# Patient Record
Sex: Male | Born: 2005 | Race: Black or African American | Hispanic: No | Marital: Single
Health system: Southern US, Community
[De-identification: ages and names within clinical notes are randomized; demographics above are authoritative.]

## PROBLEM LIST (undated history)

## (undated) DIAGNOSIS — H6690 Otitis media, unspecified, unspecified ear: Secondary | ICD-10-CM

---
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
## 2024-04-20 ENCOUNTER — Ambulatory Visit (HOSPITAL_COMMUNITY): Admission: EM | Admit: 2024-04-20 | Discharge: 2024-04-20 | Disposition: A | Discharge status: Home / Self Care

## 2024-04-20 ENCOUNTER — Encounter (HOSPITAL_COMMUNITY): Payer: Self-pay | Admitting: Emergency Medicine

## 2024-04-20 DIAGNOSIS — S61411A Laceration without foreign body of right hand, initial encounter: Secondary | ICD-10-CM | POA: Diagnosis not present

## 2024-04-20 DIAGNOSIS — Z23 Encounter for immunization: Secondary | ICD-10-CM | POA: Diagnosis not present

## 2024-04-20 MED ORDER — DOXYCYCLINE HYCLATE 100 MG PO CAPS: 100.0000 mg | ORAL_CAPSULE | Freq: Two times a day (BID) | ORAL | 0 refills | Status: AC

## 2024-04-20 MED ORDER — TETANUS-DIPHTH-ACELL PERTUSSIS 5-2-15.5 LF-MCG/0.5 IM SUSP
INTRAMUSCULAR | Status: AC
  Filled 2024-04-20: qty 0.5

## 2024-04-20 MED ORDER — DICLOFENAC SODIUM 50 MG PO TBEC: 50.0000 mg | DELAYED_RELEASE_TABLET | Freq: Two times a day (BID) | ORAL | 1 refills | Status: AC

## 2024-04-20 MED ORDER — TETANUS-DIPHTH-ACELL PERTUSSIS 5-2-15.5 LF-MCG/0.5 IM SUSP
0.5000 mL | Freq: Once | INTRAMUSCULAR | Status: AC
  Administered 2024-04-20: 0.5 mL via INTRAMUSCULAR

## 2024-04-20 NOTE — ED Triage Notes (Signed)
 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 Pt reports pain with certain movements of hand.

## 2024-04-20 NOTE — ED Provider Notes (Signed)
 " [facility] [place]  Note:  This document was prepared using Dragon voice recognition software and may include unintentional dictation errors.  MRN: [id number] DOB: [birth year]  Subjective:   Jeffrey Donaldson is a 19 y.o. 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  Patient reports that accident happened around 3 PM he was washing the fender of the car when a metal trim piece with a sharp point jabbed him in the palm of his right hand.  Patient reports some mild pain with certain movements and swelling.  Patient reports that wound was bleeding pretty profusely after injury occurred but bleeding is well-controlled with direct pressure.  Patient does not remember the date of last tetanus booster.  Patient is here for evaluation see if he needs sutures.  Current Medications[1]   Allergies[2]  Past Medical History:  Diagnosis Date   Otitis media      Past Surgical History:  Procedure Laterality Date   CLOSED REDUCTION WRIST FRACTURE  07/09/2011   Procedure: CLOSED REDUCTION WRIST;  Surgeon: Oneil JAYSON Herald, MD;  Location: [facility];  Service: Orthopedics;  Laterality: Right;   TYMPANOSTOMY  2007    Family History  Problem Relation Age of Onset   Hypertension Mother    Healthy Father    Healthy Sister    Healthy Brother    Healthy Sister    Healthy Sister    Healthy Brother    Healthy Brother     Social History[3]  ROS Refer to HPI for ROS details.  Objective:    Vitals: BP 119/71 (BP Location: Left Arm)   Pulse 88   Temp 98.1 F (36.7 C) (Oral)   Resp 14   SpO2 95%   Physical Exam Vitals and nursing note reviewed.  Constitutional:      General: He is not in acute distress.    Appearance: Normal appearance. He is well-developed. He is not ill-appearing or toxic-appearing.  HENT:     Head: Normocephalic.  Cardiovascular:     Rate and Rhythm: Normal rate.  Pulmonary:     Effort: Pulmonary  effort is normal. No respiratory distress.     Breath sounds: No stridor. No wheezing.  Skin:    General: Skin is warm and dry.     Findings: Laceration present. No abrasion, abscess, bruising or erythema.      Neurological:     General: No focal deficit present.     Mental Status: He is alert and oriented to person, place, and time.  Psychiatric:        Mood and Affect: Mood normal.        Behavior: Behavior normal.     Procedures  No results found for this or any previous visit (from the past 24 hours).  Assessment and Plan :     Discharge Instructions       1. Laceration of right hand without foreign body, initial encounter (Primary) - Wound care (Clean Wound) with wound cleanser prior to provider evaluation - Tdap (ADACEL ) injection 0.5 mL given in UC for tetanus prevention - Apply dressing to wound on right hand to prevent secondary infection and bleeding - doxycycline  (VIBRAMYCIN ) 100 MG capsule; Take 1 capsule (100 mg total) by mouth 2 (two) times daily.  Dispense: 20 capsule; Refill: 0 - diclofenac  (VOLTAREN ) 50 MG EC tablet; Take 1 tablet (50 mg total) by mouth 2 (two) times daily.  Dispense: 30 tablet; Refill: 1 - Keep  pressure bandage in place for 24 hours after which you may remove and reapply antibiotic ointment and fresh bandage. -Do not scrub the suture site as this could cause Steri-Strips to dislodge and reopen wound. -Continue to monitor for signs of infection such as increased pain, purulent drainage, increased redness, increased swelling, increased pain.  If you experience any escalation of symptoms follow-up for further evaluation management  -Continue to monitor symptoms for any change in severity if there is any escalation of current symptoms or development of new symptoms follow-up in ER for further evaluation and management.      Jeffrey Donaldson    [1]  Current Facility-Administered Medications:    Tdap (ADACEL ) injection 0.5 mL, 0.5 mL,  Intramuscular, Once, Hamid Brookens B, NP  Current Outpatient Medications:    diclofenac  (VOLTAREN ) 50 MG EC tablet, Take 1 tablet (50 mg total) by mouth 2 (two) times daily., Disp: 30 tablet, Rfl: 1   doxycycline  (VIBRAMYCIN ) 100 MG capsule, Take 1 capsule (100 mg total) by mouth 2 (two) times daily., Disp: 20 capsule, Rfl: 0   albuterol  (VENTOLIN  HFA) 108 (90 Base) MCG/ACT inhaler, Inhale 1-2 puffs into the lungs every 6 (six) hours as needed for wheezing or shortness of breath., Disp: 8 g, Rfl: 0 [2] No Known Allergies [3]  Social History Tobacco Use   Smoking status: Passive Smoke Exposure - Never Smoker   Smokeless tobacco: Never  Vaping Use   Vaping status: Never Used  Substance Use Topics   Alcohol use: No   Drug use: No     [name], NP 04/20/24 2036  "

## 2024-04-20 NOTE — Discharge Instructions (Addendum)
" °  1. Laceration of right hand without foreign body, initial encounter (Primary) - Wound care (Clean Wound) with wound cleanser prior to provider evaluation - Tdap (ADACEL ) injection 0.5 mL given in UC for tetanus prevention - Apply dressing to wound on right hand to prevent secondary infection and bleeding - doxycycline  (VIBRAMYCIN ) 100 MG capsule; Take 1 capsule (100 mg total) by mouth 2 (two) times daily.  Dispense: 20 capsule; Refill: 0 - diclofenac  (VOLTAREN ) 50 MG EC tablet; Take 1 tablet (50 mg total) by mouth 2 (two) times daily.  Dispense: 30 tablet; Refill: 1 - Keep pressure bandage in place for 24 hours after which you may remove and reapply antibiotic ointment and fresh bandage. -Do not scrub the suture site as this could cause Steri-Strips to dislodge and reopen wound. -Continue to monitor for signs of infection such as increased pain, purulent drainage, increased redness, increased swelling, increased pain.  If you experience any escalation of symptoms follow-up for further evaluation management  -Continue to monitor symptoms for any change in severity if there is any escalation of current symptoms or development of new symptoms follow-up in ER for further evaluation and management. "
